# Patient Record
Sex: Male | Born: 1980 | Race: White | Hispanic: No | Marital: Single | Smoking: Current every day smoker
Health system: Southern US, Community
[De-identification: ages and names within clinical notes are randomized; demographics above are authoritative.]

## PROBLEM LIST (undated history)

## (undated) DIAGNOSIS — N289 Disorder of kidney and ureter, unspecified: Secondary | ICD-10-CM

## (undated) DIAGNOSIS — N200 Calculus of kidney: Secondary | ICD-10-CM

## (undated) HISTORY — PX: KIDNEY STONE SURGERY: SHX686

---

## 2011-05-04 ENCOUNTER — Emergency Department (HOSPITAL_COMMUNITY)
Admission: EM | Admit: 2011-05-04 | Discharge: 2011-05-04 | Disposition: A | Discharge status: Home / Self Care | Payer: Self-pay | Attending: Emergency Medicine | Admitting: Emergency Medicine

## 2011-05-04 ENCOUNTER — Encounter: Payer: Self-pay | Admitting: Emergency Medicine

## 2011-05-04 DIAGNOSIS — N200 Calculus of kidney: Secondary | ICD-10-CM | POA: Insufficient documentation

## 2011-05-04 DIAGNOSIS — R112 Nausea with vomiting, unspecified: Secondary | ICD-10-CM | POA: Insufficient documentation

## 2011-05-04 DIAGNOSIS — R109 Unspecified abdominal pain: Secondary | ICD-10-CM | POA: Insufficient documentation

## 2011-05-04 DIAGNOSIS — R51 Headache: Secondary | ICD-10-CM | POA: Insufficient documentation

## 2011-05-04 DIAGNOSIS — R197 Diarrhea, unspecified: Secondary | ICD-10-CM | POA: Insufficient documentation

## 2011-05-04 DIAGNOSIS — E869 Volume depletion, unspecified: Secondary | ICD-10-CM | POA: Insufficient documentation

## 2011-05-04 DIAGNOSIS — F17200 Nicotine dependence, unspecified, uncomplicated: Secondary | ICD-10-CM | POA: Insufficient documentation

## 2011-05-04 HISTORY — DX: Disorder of kidney and ureter, unspecified: N28.9

## 2011-05-04 LAB — DIFFERENTIAL
Basophils Absolute: 0 10*3/uL (ref 0.0–0.1)
Basophils Relative: 0 % (ref 0–1)
Eosinophils Absolute: 0 10*3/uL (ref 0.0–0.7)
Eosinophils Relative: 0 % (ref 0–5)
Monocytes Absolute: 0.8 10*3/uL (ref 0.1–1.0)
Monocytes Relative: 6 % (ref 3–12)
Neutro Abs: 11.3 10*3/uL — ABNORMAL HIGH (ref 1.7–7.7)

## 2011-05-04 LAB — CBC
HCT: 50.5 % (ref 39.0–52.0)
Hemoglobin: 18.5 g/dL — ABNORMAL HIGH (ref 13.0–17.0)
MCH: 30.4 pg (ref 26.0–34.0)
MCHC: 36.6 g/dL — ABNORMAL HIGH (ref 30.0–36.0)
MCV: 82.9 fL (ref 78.0–100.0)
RDW: 12.9 % (ref 11.5–15.5)

## 2011-05-04 LAB — COMPREHENSIVE METABOLIC PANEL
AST: 21 U/L (ref 0–37)
Albumin: 5.3 g/dL — ABNORMAL HIGH (ref 3.5–5.2)
BUN: 22 mg/dL (ref 6–23)
Calcium: 10.7 mg/dL — ABNORMAL HIGH (ref 8.4–10.5)
Creatinine, Ser: 1.45 mg/dL — ABNORMAL HIGH (ref 0.50–1.35)
Total Bilirubin: 0.6 mg/dL (ref 0.3–1.2)
Total Protein: 9.1 g/dL — ABNORMAL HIGH (ref 6.0–8.3)

## 2011-05-04 LAB — LIPASE, BLOOD: Lipase: 19 U/L (ref 11–59)

## 2011-05-04 MED ORDER — HYDROMORPHONE HCL PF 1 MG/ML IJ SOLN
1.0000 mg | Freq: Once | INTRAMUSCULAR | Status: AC
  Administered 2011-05-04: 1 mg via INTRAVENOUS
  Filled 2011-05-04: qty 1

## 2011-05-04 MED ORDER — SODIUM CHLORIDE 0.9 % IV BOLUS (SEPSIS)
1000.0000 mL | Freq: Once | INTRAVENOUS | Status: AC
  Administered 2011-05-04: 1000 mL via INTRAVENOUS

## 2011-05-04 MED ORDER — ONDANSETRON HCL 4 MG/2ML IJ SOLN
4.0000 mg | Freq: Once | INTRAMUSCULAR | Status: AC
  Administered 2011-05-04: 4 mg via INTRAVENOUS
  Filled 2011-05-04: qty 2

## 2011-05-04 MED ORDER — SODIUM CHLORIDE 0.9 % IV BOLUS (SEPSIS)
2000.0000 mL | Freq: Once | INTRAVENOUS | Status: AC
  Administered 2011-05-04: 2000 mL via INTRAVENOUS

## 2011-05-04 MED ORDER — PROMETHAZINE HCL 25 MG PO TABS: 25.0000 mg | ORAL_TABLET | Freq: Four times a day (QID) | ORAL | Status: DC | PRN

## 2011-05-04 MED ORDER — DIPHENHYDRAMINE HCL 50 MG/ML IJ SOLN
50.0000 mg | Freq: Once | INTRAMUSCULAR | Status: AC
  Administered 2011-05-04: 50 mg via INTRAVENOUS
  Filled 2011-05-04: qty 1

## 2011-05-04 NOTE — ED Notes (Signed)
Patient c/o headache, N/V/D that started this morning. Reports he smoked after a friend yesterday that had the flu. Patient c/o diffuse, aching abdominal pain. Patient alert/oriented x 4. Denies vision changes.

## 2011-05-04 NOTE — ED Notes (Signed)
Pt c/o n/v/d with headache since 8am.

## 2011-05-04 NOTE — ED Notes (Signed)
Pt requesting crackers, MD made aware, crackers given per MD order.

## 2011-05-04 NOTE — ED Notes (Signed)
MD at bedside. 

## 2011-05-04 NOTE — ED Notes (Signed)
Patient is resting comfortably. 

## 2011-05-04 NOTE — ED Provider Notes (Signed)
History     CSN: [id number] Arrival date & time: 05/04/2011  5:33 PM   First MD Initiated Contact with Patient 05/04/11 1739      Chief Complaint  Patient presents with  . Emesis  . Diarrhea  . Headache    (Consider location/radiation/quality/duration/timing/severity/associated sxs/prior treatment) HPI Patient with nausea, vomiting, and diarrhea today.  Patient with crampy abdominal pain.  No fever, decreased pos.  Reports many episodes of vomiting with watery emesis and liquid stool.  Patient states history of chronic kidney stones but does not feel like kidney stones.  Patient drank about six beers last night.   Past Medical History  Diagnosis Date  . Renal disorder     History reviewed. No pertinent past surgical history.  History reviewed. No pertinent family history.  History  Substance Use Topics  . Smoking status: Current Everyday Smoker    Types: Cigarettes  . Smokeless tobacco: Not on file  . Alcohol Use: Yes      Review of Systems  All other systems reviewed and are negative.    Allergies  Review of patient's allergies indicates no known allergies.  Home Medications  No current outpatient prescriptions on file.  BP 141/94  Pulse 103  Temp(Src) 98.5 F (36.9 C) (Oral)  Resp 20  Ht 6' (1.829 m)  Wt 160 lb (72.576 kg)  BMI 21.70 kg/m2  SpO2 98%  Physical Exam  Nursing note and vitals reviewed. Constitutional: He is oriented to person, place, and time. He appears well-developed.  HENT:  Head: Normocephalic and atraumatic.  Eyes: Conjunctivae and EOM are normal. Pupils are equal, round, and reactive to light.  Neck: Normal range of motion. Neck supple.  Cardiovascular: Normal rate.   Pulmonary/Chest: Effort normal and breath sounds normal.  Abdominal: Soft. Bowel sounds are normal. There is no tenderness.  Musculoskeletal: Normal range of motion.  Neurological: He is alert and oriented to person, place, and time. He has normal reflexes.    Skin: Skin is warm and dry.  Psychiatric: He has a normal mood and affect.    ED Course  Procedures (including critical care time) Results for orders placed during the hospital encounter of 05/04/11  CBC      Component Value Range   WBC 13.4 (*) 4.0 - 10.5 (K/uL)   RBC 6.09 (*) 4.22 - 5.81 (MIL/uL)   Hemoglobin 18.5 (*) 13.0 - 17.0 (g/dL)   HCT 40.9  81.1 - 91.4 (%)   MCV 82.9  78.0 - 100.0 (fL)   MCH 30.4  26.0 - 34.0 (pg)   MCHC 36.6 (*) 30.0 - 36.0 (g/dL)   RDW 78.2  95.6 - 21.3 (%)   Platelets 287  150 - 400 (K/uL)  DIFFERENTIAL      Component Value Range   Neutrophils Relative 85 (*) 43 - 77 (%)   Neutro Abs 11.3 (*) 1.7 - 7.7 (K/uL)   Lymphocytes Relative 9 (*) 12 - 46 (%)   Lymphs Abs 1.2  0.7 - 4.0 (K/uL)   Monocytes Relative 6  3 - 12 (%)   Monocytes Absolute 0.8  0.1 - 1.0 (K/uL)   Eosinophils Relative 0  0 - 5 (%)   Eosinophils Absolute 0.0  0.0 - 0.7 (K/uL)   Basophils Relative 0  0 - 1 (%)   Basophils Absolute 0.0  0.0 - 0.1 (K/uL)  COMPREHENSIVE METABOLIC PANEL      Component Value Range   Sodium 139  135 - 145 (mEq/L)   Potassium  4.3  3.5 - 5.1 (mEq/L)   Chloride 100  96 - 112 (mEq/L)   CO2 25  19 - 32 (mEq/L)   Glucose, Bld 100 (*) 70 - 99 (mg/dL)   BUN 22  6 - 23 (mg/dL)   Creatinine, Ser 7.84 (*) 0.50 - 1.35 (mg/dL)   Calcium 69.6 (*) 8.4 - 10.5 (mg/dL)   Total Protein 9.1 (*) 6.0 - 8.3 (g/dL)   Albumin 5.3 (*) 3.5 - 5.2 (g/dL)   AST 21  0 - 37 (U/L)   ALT 45  0 - 53 (U/L)   Alkaline Phosphatase 98  39 - 117 (U/L)   Total Bilirubin 0.6  0.3 - 1.2 (mg/dL)   GFR calc non Af Amer 63 (*) >90 (mL/min)   GFR calc Af Amer 73 (*) >90 (mL/min)  LIPASE, BLOOD      Component Value Range   Lipase 19  11 - 59 (U/L)     No diagnosis found.    MDM  Patient given ns 3 liters, i mg dilaudid and zofran.  Patient feels improved and taking po without difficulty.  Patient advised follow up for recheck of creatinine.         Hilario Quarry, MD 05/04/11  [phone]

## 2011-05-04 NOTE — ED Notes (Signed)
Patient's significant other out to nurse's station stating patient had hives on his left arm. No respiratory distress noted upon assessment of patient. Dr Rosalia Hammers informed and order for benadryl 50 mg obtained and given by Vernell Barrier, RN. Will continue to monitor patient.

## 2012-11-18 ENCOUNTER — Emergency Department (HOSPITAL_COMMUNITY): Payer: Self-pay

## 2012-11-18 ENCOUNTER — Emergency Department (HOSPITAL_COMMUNITY)
Admission: EM | Admit: 2012-11-18 | Discharge: 2012-11-18 | Disposition: A | Discharge status: Home / Self Care | Payer: Self-pay | Attending: Emergency Medicine | Admitting: Emergency Medicine

## 2012-11-18 ENCOUNTER — Encounter (HOSPITAL_COMMUNITY): Payer: Self-pay | Admitting: *Deleted

## 2012-11-18 DIAGNOSIS — F17200 Nicotine dependence, unspecified, uncomplicated: Secondary | ICD-10-CM | POA: Insufficient documentation

## 2012-11-18 DIAGNOSIS — N201 Calculus of ureter: Secondary | ICD-10-CM | POA: Insufficient documentation

## 2012-11-18 LAB — CBC WITH DIFFERENTIAL/PLATELET
Basophils Absolute: 0 10*3/uL (ref 0.0–0.1)
Basophils Relative: 0 % (ref 0–1)
HCT: 37.5 % — ABNORMAL LOW (ref 39.0–52.0)
Hemoglobin: 13.3 g/dL (ref 13.0–17.0)
Lymphocytes Relative: 29 % (ref 12–46)
MCHC: 35.5 g/dL (ref 30.0–36.0)
Monocytes Relative: 9 % (ref 3–12)
Neutro Abs: 3.9 10*3/uL (ref 1.7–7.7)
Neutrophils Relative %: 60 % (ref 43–77)
RDW: 12.8 % (ref 11.5–15.5)
WBC: 6.5 10*3/uL (ref 4.0–10.5)

## 2012-11-18 LAB — COMPREHENSIVE METABOLIC PANEL
ALT: 17 U/L (ref 0–53)
AST: 22 U/L (ref 0–37)
Albumin: 3.5 g/dL (ref 3.5–5.2)
Alkaline Phosphatase: 62 U/L (ref 39–117)
CO2: 29 mEq/L (ref 19–32)
Chloride: 103 mEq/L (ref 96–112)
GFR calc non Af Amer: 83 mL/min — ABNORMAL LOW (ref >90)
Potassium: 3.6 mEq/L (ref 3.5–5.1)
Total Bilirubin: 0.4 mg/dL (ref 0.3–1.2)

## 2012-11-18 LAB — URINALYSIS, ROUTINE W REFLEX MICROSCOPIC
Glucose, UA: NEGATIVE mg/dL
Leukocytes, UA: NEGATIVE
Nitrite: NEGATIVE
pH: 7 (ref 5.0–8.0)

## 2012-11-18 MED ORDER — HYDROMORPHONE HCL PF 1 MG/ML IJ SOLN
1.0000 mg | Freq: Once | INTRAMUSCULAR | Status: AC
  Administered 2012-11-18: 1 mg via INTRAVENOUS
  Filled 2012-11-18: qty 1

## 2012-11-18 MED ORDER — KETOROLAC TROMETHAMINE 30 MG/ML IJ SOLN
30.0000 mg | Freq: Once | INTRAMUSCULAR | Status: AC
  Administered 2012-11-18: 30 mg via INTRAVENOUS
  Filled 2012-11-18: qty 1

## 2012-11-18 MED ORDER — SODIUM CHLORIDE 0.9 % IV BOLUS (SEPSIS)
1000.0000 mL | Freq: Once | INTRAVENOUS | Status: AC
  Administered 2012-11-18: 1000 mL via INTRAVENOUS

## 2012-11-18 MED ORDER — ONDANSETRON HCL 4 MG/2ML IJ SOLN
4.0000 mg | Freq: Once | INTRAMUSCULAR | Status: AC
  Administered 2012-11-18: 4 mg via INTRAVENOUS
  Filled 2012-11-18: qty 2

## 2012-11-18 NOTE — ED Provider Notes (Signed)
History    CSN: [id number] Arrival date & time 11/18/12  1627  First MD Initiated Contact with Patient 11/18/12 1949     Chief Complaint  Patient presents with  . Flank Pain   (Consider location/radiation/quality/duration/timing/severity/associated sxs/prior Treatment) HPI  Patient with right flank pain since Sunday.  Seen at [facility] and told he had a kidney stone.  Prescribed zofran and oxycodone and states too expensive and waiting for check to come in.  States he has had kidney stones since age 22, but states he does not have a urologist.  Pain worse today.  States hasn't voided all day and has vomited whenever he drinks.   Past Medical History  Diagnosis Date  . Renal disorder    History reviewed. No pertinent past surgical history. History reviewed. No pertinent family history. History  Substance Use Topics  . Smoking status: Current Every Day Smoker    Types: Cigarettes  . Smokeless tobacco: Not on file  . Alcohol Use: Yes    Review of Systems  All other systems reviewed and are negative.    Allergies  Review of patient's allergies indicates no known allergies.  Home Medications  No current outpatient prescriptions on file. BP 127/83  Pulse 70  Temp(Src) 98.9 F (37.2 C) (Oral)  Resp 20  Ht 6' (1.829 m)  Wt 190 lb (86.183 kg)  BMI 25.76 kg/m2  SpO2 98% Physical Exam  Nursing note and vitals reviewed. Constitutional: He is oriented to person, place, and time. He appears well-developed and well-nourished.  HENT:  Head: Normocephalic and atraumatic.  Right Ear: External ear normal.  Left Ear: External ear normal.  Nose: Nose normal.  Mouth/Throat: Oropharynx is clear and moist.  Eyes: Conjunctivae and EOM are normal. Pupils are equal, round, and reactive to light.  Neck: Normal range of motion. Neck supple.  Cardiovascular: Normal rate, regular rhythm, normal heart sounds and intact distal pulses.   Pulmonary/Chest: Effort normal and breath  sounds normal. No respiratory distress. He has no wheezes. He exhibits no tenderness.  Abdominal: Soft. Bowel sounds are normal. He exhibits no distension and no mass. There is no tenderness. There is no guarding.  Musculoskeletal: Normal range of motion.  Neurological: He is alert and oriented to person, place, and time. He has normal reflexes. He exhibits normal muscle tone. Coordination normal.  Skin: Skin is warm and dry.  Psychiatric: He has a normal mood and affect. His behavior is normal. Judgment and thought content normal.    ED Course  Procedures (including critical care time) Labs Reviewed - No data to display No results found. No diagnosis found.  MDM   Results for orders placed during the hospital encounter of 11/18/12  CBC WITH DIFFERENTIAL      Result Value Range   WBC 6.5  4.0 - 10.5 K/uL   RBC 4.68  4.22 - 5.81 MIL/uL   Hemoglobin 13.3  13.0 - 17.0 g/dL   HCT 52.8 (*) 41.3 - 24.4 %   MCV 80.1  78.0 - 100.0 fL   MCH 28.4  26.0 - 34.0 pg   MCHC 35.5  30.0 - 36.0 g/dL   RDW 01.0  27.2 - 53.6 %   Platelets 198  150 - 400 K/uL   Neutrophils Relative % 60  43 - 77 %   Neutro Abs 3.9  1.7 - 7.7 K/uL   Lymphocytes Relative 29  12 - 46 %   Lymphs Abs 1.9  0.7 - 4.0 K/uL  Monocytes Relative 9  3 - 12 %   Monocytes Absolute 0.6  0.1 - 1.0 K/uL   Eosinophils Relative 1  0 - 5 %   Eosinophils Absolute 0.1  0.0 - 0.7 K/uL   Basophils Relative 0  0 - 1 %   Basophils Absolute 0.0  0.0 - 0.1 K/uL   Ct Abdomen Pelvis Wo Contrast  11/18/2012   *RADIOLOGY REPORT*  Clinical Data: Right flank pain.  CT ABDOMEN AND PELVIS WITHOUT CONTRAST  Technique:  Multidetector CT imaging of the abdomen and pelvis was performed following the standard protocol without intravenous contrast.  Comparison: None.  Findings: The lung bases are clear.  No pleural effusion or pericardial effusion.  The unenhanced appearance of the liver is unremarkable.  No biliary dilatation.  Gallbladder is normal.  No  common bile duct dilatation.  The pancreas is normal.  The spleen is normal.  The adrenal glands are normal.  Both kidneys demonstrate renal calculi.  There is moderate right- sided hydronephrosis and hydroureter down to an obstructing 6 x 5 mm right ureteral calculus located at the level of the iliac crest. No distal ureteral or bladder calculi.  The prostate gland and seminal vesicles are unremarkable.  The stomach, duodenum, small bowel and colon are grossly normal without oral contrast.  The appendix is normal.  No mesenteric or retroperitoneal mass or adenopathy.  The aorta is normal in caliber.  No atherosclerotic calcifications.  The bladder is unremarkable.  No pelvic mass, adenopathy or free pelvic fluid collections.  No inguinal mass or hernia. Calcifications are noted along the lateral margin of the gluteus medius muscle likely due to a previous muscle injury.  The bony structures are intact.  IMPRESSION:  1.  Bilateral renal calculi. 2.  6 x 5 mm right mid ureteral calculus located just above the right iliac crest causing moderate to high-grade obstruction.   Original Report Authenticated By: Rudie Meyer, M.D.    Patient with mid right ureteral calculus. This is causing high-grade obstruction.  Discussed with Dr. Jerre Simon and patient given appointment at 4 pm tomorrow.  Patient is pain free now and will fill prescription for percocet and zofran and follow up for possible for possible intervention.   Hilario Quarry, MD 11/18/12 [phone]

## 2012-11-18 NOTE — ED Notes (Addendum)
Rt flank pain, Seen at [facility] ER and told he has a kidney stone.  Hx of same  Says he could not afford the meds he was written for.

## 2012-11-19 ENCOUNTER — Ambulatory Visit (HOSPITAL_COMMUNITY)
Admission: RE | Admit: 2012-11-19 | Discharge: 2012-11-19 | Disposition: A | Discharge status: Home / Self Care | Payer: Self-pay | Source: Ambulatory Visit | Attending: Urology | Admitting: Urology

## 2012-11-19 ENCOUNTER — Other Ambulatory Visit (HOSPITAL_COMMUNITY): Payer: Self-pay | Admitting: Urology

## 2012-11-19 DIAGNOSIS — N200 Calculus of kidney: Secondary | ICD-10-CM

## 2012-11-19 DIAGNOSIS — R1031 Right lower quadrant pain: Secondary | ICD-10-CM | POA: Insufficient documentation

## 2013-12-26 ENCOUNTER — Encounter (HOSPITAL_COMMUNITY): Payer: Self-pay | Admitting: Emergency Medicine

## 2013-12-26 ENCOUNTER — Emergency Department (HOSPITAL_COMMUNITY)
Admission: EM | Admit: 2013-12-26 | Discharge: 2013-12-26 | Disposition: A | Discharge status: Home / Self Care | Payer: Self-pay | Attending: Emergency Medicine | Admitting: Emergency Medicine

## 2013-12-26 ENCOUNTER — Emergency Department (HOSPITAL_COMMUNITY): Payer: Self-pay

## 2013-12-26 DIAGNOSIS — Z87448 Personal history of other diseases of urinary system: Secondary | ICD-10-CM | POA: Insufficient documentation

## 2013-12-26 DIAGNOSIS — K2990 Gastroduodenitis, unspecified, without bleeding: Principal | ICD-10-CM

## 2013-12-26 DIAGNOSIS — R109 Unspecified abdominal pain: Secondary | ICD-10-CM | POA: Insufficient documentation

## 2013-12-26 DIAGNOSIS — R51 Headache: Secondary | ICD-10-CM | POA: Insufficient documentation

## 2013-12-26 DIAGNOSIS — F17200 Nicotine dependence, unspecified, uncomplicated: Secondary | ICD-10-CM | POA: Insufficient documentation

## 2013-12-26 DIAGNOSIS — R112 Nausea with vomiting, unspecified: Secondary | ICD-10-CM

## 2013-12-26 DIAGNOSIS — M549 Dorsalgia, unspecified: Secondary | ICD-10-CM | POA: Insufficient documentation

## 2013-12-26 DIAGNOSIS — R079 Chest pain, unspecified: Secondary | ICD-10-CM | POA: Insufficient documentation

## 2013-12-26 DIAGNOSIS — Z87442 Personal history of urinary calculi: Secondary | ICD-10-CM | POA: Insufficient documentation

## 2013-12-26 DIAGNOSIS — K2970 Gastritis, unspecified, without bleeding: Secondary | ICD-10-CM | POA: Insufficient documentation

## 2013-12-26 HISTORY — DX: Calculus of kidney: N20.0

## 2013-12-26 LAB — CBC WITH DIFFERENTIAL/PLATELET
BASOS ABS: 0 10*3/uL (ref 0.0–0.1)
Basophils Relative: 0 % (ref 0–1)
Eosinophils Absolute: 0.1 10*3/uL (ref 0.0–0.7)
Eosinophils Relative: 1 % (ref 0–5)
HCT: 47.8 % (ref 39.0–52.0)
Hemoglobin: 17.2 g/dL — ABNORMAL HIGH (ref 13.0–17.0)
LYMPHS ABS: 2.3 10*3/uL (ref 0.7–4.0)
LYMPHS PCT: 19 % (ref 12–46)
MCH: 29.2 pg (ref 26.0–34.0)
MCHC: 36 g/dL (ref 30.0–36.0)
MCV: 81 fL (ref 78.0–100.0)
Monocytes Absolute: 0.8 10*3/uL (ref 0.1–1.0)
Monocytes Relative: 7 % (ref 3–12)
NEUTROS PCT: 73 % (ref 43–77)
Neutro Abs: 8.9 10*3/uL — ABNORMAL HIGH (ref 1.7–7.7)
PLATELETS: 264 10*3/uL (ref 150–400)
RBC: 5.9 MIL/uL — AB (ref 4.22–5.81)
RDW: 13 % (ref 11.5–15.5)
WBC: 12 10*3/uL — AB (ref 4.0–10.5)

## 2013-12-26 LAB — COMPREHENSIVE METABOLIC PANEL
ALK PHOS: 99 U/L (ref 39–117)
ALT: 24 U/L (ref 0–53)
AST: 18 U/L (ref 0–37)
Albumin: 5 g/dL (ref 3.5–5.2)
Anion gap: 16 — ABNORMAL HIGH (ref 5–15)
BUN: 18 mg/dL (ref 6–23)
CO2: 25 meq/L (ref 19–32)
Calcium: 10.1 mg/dL (ref 8.4–10.5)
Chloride: 100 mEq/L (ref 96–112)
Creatinine, Ser: 1.3 mg/dL (ref 0.50–1.35)
GFR, EST AFRICAN AMERICAN: 81 mL/min — AB (ref >90)
GFR, EST NON AFRICAN AMERICAN: 70 mL/min — AB (ref >90)
GLUCOSE: 134 mg/dL — AB (ref 70–99)
POTASSIUM: 3.4 meq/L — AB (ref 3.7–5.3)
SODIUM: 141 meq/L (ref 137–147)
TOTAL PROTEIN: 8.5 g/dL — AB (ref 6.0–8.3)
Total Bilirubin: 0.7 mg/dL (ref 0.3–1.2)

## 2013-12-26 LAB — URINALYSIS, ROUTINE W REFLEX MICROSCOPIC
Bilirubin Urine: NEGATIVE
GLUCOSE, UA: NEGATIVE mg/dL
Ketones, ur: 15 mg/dL — AB
LEUKOCYTES UA: NEGATIVE
Nitrite: NEGATIVE
PH: 5.5 (ref 5.0–8.0)
PROTEIN: NEGATIVE mg/dL
Specific Gravity, Urine: 1.01 (ref 1.005–1.030)
Urobilinogen, UA: 0.2 mg/dL (ref 0.0–1.0)

## 2013-12-26 LAB — URINE MICROSCOPIC-ADD ON

## 2013-12-26 LAB — LIPASE, BLOOD: Lipase: 23 U/L (ref 11–59)

## 2013-12-26 MED ORDER — PROMETHAZINE HCL 25 MG PO TABS: 25.0000 mg | ORAL_TABLET | Freq: Four times a day (QID) | ORAL | Status: DC | PRN

## 2013-12-26 MED ORDER — PANTOPRAZOLE SODIUM 40 MG IV SOLR
40.0000 mg | Freq: Once | INTRAVENOUS | Status: AC
  Administered 2013-12-26: 40 mg via INTRAVENOUS
  Filled 2013-12-26: qty 40

## 2013-12-26 MED ORDER — HYDROCODONE-ACETAMINOPHEN 5-325 MG PO TABS: 1.0000 | ORAL_TABLET | Freq: Four times a day (QID) | ORAL | Status: DC | PRN

## 2013-12-26 MED ORDER — METOCLOPRAMIDE HCL 5 MG/ML IJ SOLN
INTRAMUSCULAR | Status: DC
Start: 2013-12-26 — End: 2013-12-27
  Filled 2013-12-26: qty 2

## 2013-12-26 MED ORDER — IOHEXOL 300 MG/ML  SOLN
100.0000 mL | Freq: Once | INTRAMUSCULAR | Status: AC | PRN
  Administered 2013-12-26: 100 mL via INTRAVENOUS

## 2013-12-26 MED ORDER — ONDANSETRON 4 MG PO TBDP: 4.0000 mg | ORAL_TABLET | Freq: Three times a day (TID) | ORAL | Status: DC | PRN

## 2013-12-26 MED ORDER — FENTANYL CITRATE 0.05 MG/ML IJ SOLN
50.0000 ug | Freq: Once | INTRAMUSCULAR | Status: AC
  Administered 2013-12-26: 19:00:00 via INTRAVENOUS

## 2013-12-26 MED ORDER — SODIUM CHLORIDE 0.9 % IJ SOLN
INTRAMUSCULAR | Status: AC
  Filled 2013-12-26: qty 24

## 2013-12-26 MED ORDER — SODIUM CHLORIDE 0.9 % IJ SOLN
INTRAMUSCULAR | Status: DC
Start: 2013-12-26 — End: 2013-12-27
  Filled 2013-12-26: qty 250

## 2013-12-26 MED ORDER — ONDANSETRON HCL 4 MG/2ML IJ SOLN: 4.0000 mg | Freq: Once | INTRAMUSCULAR | Status: DC

## 2013-12-26 MED ORDER — FAMOTIDINE 20 MG PO TABS: 20.0000 mg | ORAL_TABLET | Freq: Two times a day (BID) | ORAL | Status: DC

## 2013-12-26 MED ORDER — SODIUM CHLORIDE 0.9 % IV BOLUS (SEPSIS)
1000.0000 mL | Freq: Once | INTRAVENOUS | Status: AC
  Administered 2013-12-26: 1000 mL via INTRAVENOUS

## 2013-12-26 MED ORDER — METOCLOPRAMIDE HCL 5 MG/ML IJ SOLN
10.0000 mg | Freq: Once | INTRAMUSCULAR | Status: AC
  Administered 2013-12-26: 10 mg via INTRAVENOUS

## 2013-12-26 MED ORDER — SODIUM CHLORIDE 0.9 % IV SOLN
INTRAVENOUS | Status: DC
  Administered 2013-12-26: 21:00:00 via INTRAVENOUS

## 2013-12-26 MED ORDER — ONDANSETRON HCL 4 MG/2ML IJ SOLN
4.0000 mg | Freq: Once | INTRAMUSCULAR | Status: AC
  Administered 2013-12-26: 4 mg via INTRAVENOUS
  Filled 2013-12-26: qty 2

## 2013-12-26 MED ORDER — FENTANYL CITRATE 0.05 MG/ML IJ SOLN
INTRAMUSCULAR | Status: AC
  Filled 2013-12-26: qty 2

## 2013-12-26 MED ORDER — HYDROMORPHONE HCL PF 1 MG/ML IJ SOLN
1.0000 mg | Freq: Once | INTRAMUSCULAR | Status: AC
  Administered 2013-12-26: 1 mg via INTRAVENOUS
  Filled 2013-12-26: qty 1

## 2013-12-26 MED ORDER — ONDANSETRON HCL 4 MG/2ML IJ SOLN
INTRAMUSCULAR | Status: AC
  Filled 2013-12-26: qty 2

## 2013-12-26 MED ORDER — HYDROMORPHONE HCL PF 1 MG/ML IJ SOLN: 1.0000 mg | Freq: Once | INTRAMUSCULAR | Status: DC

## 2013-12-26 MED ORDER — ONDANSETRON HCL 4 MG/2ML IJ SOLN
4.0000 mg | Freq: Once | INTRAMUSCULAR | Status: AC
  Administered 2013-12-26: 4 mg via INTRAVENOUS

## 2013-12-26 MED ORDER — IOHEXOL 300 MG/ML  SOLN
50.0000 mL | Freq: Once | INTRAMUSCULAR | Status: AC | PRN
  Administered 2013-12-26: 50 mL via ORAL

## 2013-12-26 NOTE — Discharge Instructions (Signed)
Gastritis, Adult Gastritis is soreness and puffiness (inflammation) of the lining of the stomach. If you do not get help, gastritis can cause bleeding and sores (ulcers) in the stomach. HOME CARE   Only take medicine as told by your doctor.  If you were given antibiotic medicines, take them as told. Finish the medicines even if you start to feel better.  Drink enough fluids to keep your pee (urine) clear or pale yellow.  Avoid foods and drinks that make your problems worse. Foods you may want to avoid include:  Caffeine or alcohol.  Chocolate.  Mint.  Garlic and onions.  Spicy foods.  Citrus fruits, including oranges, lemons, or limes.  Food containing tomatoes, including sauce, chili, salsa, and pizza.  Fried and fatty foods.  Eat small meals throughout the day instead of large meals. GET HELP RIGHT AWAY IF:   You have black or dark red poop (stools).  You throw up (vomit) blood. It may look like coffee grounds.  You cannot keep fluids down.  Your belly (abdominal) pain gets worse.  You have a fever.  You do not feel better after 1 week.  You have any other questions or concerns. MAKE SURE YOU:   Understand these instructions.  Will watch your condition.  Will get help right away if you are not doing well or get worse. Document Released: 10/23/2007 Document Revised: 07/29/2011 Document Reviewed: 06/19/2011 [facility] Patient Information 2015 [employer] [place]. This information is not intended to replace advice given to you by your health care provider. Make sure you discuss any questions you have with your health care provider.  Recommend followup by GI medicine referral provided. Take the Pepcid as directed. Take the Phenergan and Zofran as needed for nausea and vomiting. Take pain medicine as needed. Return for any newer worse symptoms. Resource guide provided below to help you find a primary care Dr.   Emergency Department Resource Guide 1) Find a Doctor and  Pay Out of Pocket Although you won't have to find out who is covered by your insurance plan, it is a good idea to ask around and get recommendations. You will then need to call the office and see if the doctor you have chosen will accept you as a new patient and what types of options they offer for patients who are self-pay. Some doctors offer discounts or will set up payment plans for their patients who do not have insurance, but you will need to ask so you aren't surprised when you get to your appointment.  2) Contact Your Local Health Department Not all health departments have doctors that can see patients for sick visits, but many do, so it is worth a call to see if yours does. If you don't know where your local health department is, you can check in your phone book. The CDC also has a tool to help you locate your state's health department, and many state websites also have listings of all of their local health departments.  3) Find a Walk-in Clinic If your illness is not likely to be very severe or complicated, you may want to try a walk in clinic. These are popping up all over the country in pharmacies, drugstores, and shopping centers. They're usually staffed by nurse practitioners or physician assistants that have been trained to treat common illnesses and complaints. They're usually fairly quick and inexpensive. However, if you have serious medical issues or chronic medical problems, these are probably not your best option.  No Primary Care  Doctor: - Call Health Connect at  [phone] - they can help you locate a primary care doctor that  accepts your insurance, provides certain services, etc. - Physician Referral Service- [phone]  Chronic Pain Problems: Organization         Address  Phone   Notes  [facility]  [phone] Patients need to be referred by their primary care doctor.   Medication Assistance: Organization         Address  Phone   Notes  [facility] [address] [place] [place], [place] [address] [phone] --Must be a resident of [facility] -- Must have NO insurance coverage whatsoever (no Medicaid/ Medicare, etc.) -- The pt. MUST have a primary care doctor that directs their care regularly and follows them in the community   [facility]  [phone]   [employer]  [phone]    Agencies that provide inexpensive medical care: Organization         Address  Phone   Notes  [facility]  [phone]   [facility]    [phone]   [facility] [address] [place], [place] [address] [phone]   [facility] [address] [place] [place] [phone]   Planned Parenthood    [phone]   [facility]    [phone]   [facility]  [address], [place] Phone:  [phone], Fax:  [phone] Hours of Operation:  9 am - 6 pm, M-F.  Also accepts Medicaid/Medicare and self-pay.  [facility]  [address], [place] Phone: [phone], Fax: [phone]. Hours of Operation:  8:30 am - 5:30 pm, M-F.  Also accepts Medicaid and self-pay.  [facility] [place] [address], [place] Phone: [phone]   [facility] [address] Natasha Bence [place], [place] [phone], Ext. [phone] Mondays & Thursdays: 7-9 AM.  First 15 patients are seen on a first come, first serve basis.    Medicaid-accepting [facility] Providers:  Organization         Address  Phone   Notes  [facility] [address], [place] [phone] Also accepts self-pay patients.  [facility] [address] Laurell Josephs [place], [place]  [phone]   [facility] [address], [place] [phone]   [facility] [address] [place] [phone]   Renaye Rakers [address], [place]   [phone] Only accepts [place] Access [place] patients after they have their name applied to their card.   Self-Pay (no insurance) in [facility]:  Organization         Address  Phone   Notes  Sickle Cell Patients, [facility] [address] [place], [place] [phone]   [facility] [address] [place], [place] [phone]   [facility] [place]  [address], [place] [phone]   [facility]/Dr. Osei-Bonsu  [address] [place] or [address], [place] [phone] Phone number for both [place] and [place] locations is the same.  [facility] [address] Gin[place] Otto [phone]   [facility] [address], [place] or [address] [phone]   [facility] [address] [place] [phone], phone; [phone], fax Sees patients 1st and 3rd Saturday of every month.  Must not qualify for public or private insurance (i.e. Medicaid, Medicare, [place] Health Choice, Veterans' Benefits)  Household income should be no more than 200% of the poverty level The clinic cannot treat you if you are pregnant or think you are pregnant  Sexually transmitted diseases are not treated at the clinic.    Dental Care: Organization         Address  Phone  Notes  [facility] Department of [facility] [address] [place], [place] [phone] Accepts children up to age 49 who are enrolled in [place] or [place] Health Choice; pregnant women with a Medicaid card; and children who have applied for Medicaid or [place] Health Choice, but were declined, whose parents can pay a reduced fee at time of service.  [facility] Department of [facility]  [address], [place] [phone] Accepts children up to age 14 who are enrolled in [place] or [place] Health Choice; pregnant women with a Medicaid card; and children who have applied for Medicaid or [place] Health Choice, but were declined, whose parents can pay a reduced fee at time of service.  [facility]  [address] [place], [place] [phone] Patients are seen by appointment only. Walk-ins are not accepted. [facility] will see patients 45 years of age and older. Monday - Tuesday (8am-5pm) Most Wednesdays (8:30-5pm) $30 per visit, cash only  [facility]  [address], [facility] [phone] Patients are seen by appointment only. Walk-ins are not accepted. [facility] will see patients 53 years of age and older. One Wednesday Evening (Monthly: Volunteer Based).  $30 per visit, cash only  [employer]  [phone] for adults; Children under age 80, call Graduate Pediatric Dentistry at [phone]. Children aged 74-14, please call [phone] to request a pediatric application.  Dental services are provided in all areas of dental care including fillings, crowns and bridges, complete and partial dentures, implants, gum treatment, root canals, and extractions. Preventive care is also provided. Treatment is provided to both adults and children. Patients are selected via a lottery and there is often a waiting list.   [facility] [address] [place]  [phone] [email or web address]   [facility] [address] [place], [place] [phone], Ext. [phone] Second and Fourth Thursday of each month, opens at 6:30 AM; Clinic ends at 9 AM.  Patients are seen on a first-come first-served basis, and a limited number are seen during each clinic.   [facility]  [address] Ether Griffins [place], [place] [phone]   Eligibility Requirements You must have lived in [place], [place], or  [place] counties for at least the last three months.   You cannot be eligible for state or federal sponsored [place], including [employer], [place], or [employer].   You generally cannot be eligible for healthcare insurance through your employer.    How to apply: Eligibility screenings are held every Tuesday and Wednesday afternoon from 1:00 pm until 4:00 pm. You do not need an appointment for the interview!  [facility] [address] [place], [place] [phone]   Aaron Edelman  [employer] Department  [phone]   [facility]  [phone]   [facility] Health Department  [phone]    Behavioral Health Resources in the Community: Intensive Outpatient Programs Organization         Address  Phone  Notes  [facility] [address] [place], [place] [phone]   [facility] [address] [place], [place] [phone]   [facility] [address], [place], [place]  [phone]   [facility] Health [address]  [place], [place] [phone] or [phone]   Substance Abuse Resources Organization         Address  Phone  Notes  [facility]  [phone]   [facility]  [phone]   [facility]  [phone]   Floydene Flock  [phone]   Residential & Outpatient Substance Abuse Program  [phone]   Psychological Services Organization         Address  Phone  Notes  [facility]  [phone]   [facility]  [phone]   [facility] [address] [place] [phone] or [phone]    Mobile Crisis Teams Organization         Address  Phone  Notes  [facility], Mobile Crisis Care Unit  [phone]   [facility]  [address] [place], [place] [phone]   Doristine Locks [address] [place] [phone]    Self-Help/Support Groups Organization         Address  Phone             Notes  [facility] [place] - variety of support groups  336- I7437963 Call for more information  Narcotics Anonymous (NA), [facility] [address], [employer] [place]  2 meetings at this location   Statistician         Address  Phone  Notes  [facility] [address],    [place]  [phone]   [facility]  [address], [place] [address], [place], [place] [phone]   [facility] Treatment Facility [address] [place], [place] [phone] Admissions: 8am-3pm M-F  [facility] [address],    [place], [place] [phone]   [facility] [address] [place], [place], [place] [phone]   [facility] [address]  [place], [place] [phone]   [facility] - Intensive Outpatient [address] Laurell Josephs [address], [place], [place] [phone]   [facility] ([facility] [address] [place]  [place], [place] [phone] or [phone]   [facility] ([facility]) [address], [place], [place] [phone] Accepts Medicaid  [facility] [address],  [place] [phone] Substance Abuse/Addiction Treatment   [facility] Organization         Address  Phone  Notes  [facility]  [phone]   Angie Fava, PhD [address] Ervin Knack [place], [place]   [phone] or [phone]   [facility] Behavioral   [address] [place], [place] [phone]   [facility] [address] [place], [place] [phone] Insurance/Medicaid/sponsorship through [employer] and Families [address]  [place], [place] [phone] Therapy/tele-psych/case  °[facility] [address]  ° [place], [place] [phone]     °Dr. Arfeen  [phone]   °[facility] [place]  [employer] [place] [facility] 1) [address], [place] °2) [address], [place] °3)  [address], [place] [phone] °([phone]) [phone] ° °([phone]) [phone]   °[place] Child Abuse Hotline (336) 342-1394 or (336) [phone] (After Hours)    ° ° ° °

## 2013-12-26 NOTE — ED Notes (Signed)
PT c/o nausea vomiting and epigastric abdominal pain x2 days.

## 2013-12-26 NOTE — ED Provider Notes (Signed)
CSN: [id number]     Arrival date & time 12/26/13  1816 History  This chart was scribed for Scott Mulders, MD by Elon Spanner, ED Scribe. This patient was seen in room APA18/APA18 and the patient's care was started at 7:25 PM.    Chief Complaint  Patient presents with  . Abdominal Pain   Patient is a 33 y.o. male presenting with abdominal pain. The history is provided by the patient. No language interpreter was used.  Abdominal Pain Associated symptoms: chest pain, chills, nausea and vomiting   Associated symptoms: no cough, no diarrhea, no dysuria, no fever, no shortness of breath and no sore throat     HPI Comments: Scott Blackwell is a 33 y.o. male with a history of kidney stones who presents to the Emergency Department complaining of upper quadrant abdominal pain that began yesterday evening.  He rates his abdominal pain currently at an 8.5/10 and describes it as cramping/indigestion. He states he has associated vomiting that began yesterday morning with approximately 20 episodes of coffee-ground colored emesis both today and yesterday.  He states that a typical episode onsets with a feeling of indigestion.  He reports that the vomiting is precipitated by food or fluid intake.   The patient states that his throat is raw and that he has been spitting up blood.  He rates his associated throat pain an 8/10 currently.   The patient reports similar episodes of abdominal pain and vomiting over the last several years that often begin spontaneously at any time of the day.  He reports that when this occurs, the symptoms are relieved by a bath.  Patients states he has had associated chills, diaphoresis, CP.  Patient denies diarrhea and states that his bowel movements are normal.   Patient denies visual changes, cough, congestion, rhinorrhea, SOB, dysuria, leg swelling.  NKA.  WUJ:[id number]  Past Medical History  Diagnosis Date  . Renal disorder   . Kidney stones    Past Surgical History  Procedure  Laterality Date  . Kidney stone surgery     No family history on file. History  Substance Use Topics  . Smoking status: Current Every Day Smoker -- 1.00 packs/day    Types: Cigarettes  . Smokeless tobacco: Not on file  . Alcohol Use: Yes     Comment: occassionally    Review of Systems  Constitutional: Positive for chills and diaphoresis. Negative for fever.  HENT: Negative for congestion, rhinorrhea and sore throat.   Eyes: Negative for visual disturbance.  Respiratory: Negative for cough and shortness of breath.   Cardiovascular: Positive for chest pain. Negative for leg swelling.  Gastrointestinal: Positive for nausea, vomiting and abdominal pain. Negative for diarrhea.  Genitourinary: Negative for dysuria.  Musculoskeletal: Positive for back pain. Negative for neck pain.  Skin: Negative for rash.  Neurological: Positive for headaches.  Hematological: Does not bruise/bleed easily.  Psychiatric/Behavioral: Negative for confusion.      Allergies  Review of patient's allergies indicates no known allergies.  Home Medications   Prior to Admission medications   Medication Sig Start Date End Date Taking? Authorizing Provider  bismuth subsalicylate (PEPTO BISMOL) 262 MG/15ML suspension Take 30 mLs by mouth once as needed for indigestion.   Yes Historical Provider, MD  Ca Carbonate-Mag Hydroxide (ROLAIDS PO) Take 1-2 tablets by mouth as needed.   Yes Historical Provider, MD  famotidine (PEPCID) 20 MG tablet Take 1 tablet (20 mg total) by mouth 2 (two) times daily. 12/26/13   Scott Mulders, MD  HYDROcodone-acetaminophen (NORCO/VICODIN) 5-325 MG per tablet Take 1-2 tablets by mouth every 6 (six) hours as needed. 12/26/13   Scott MuldersScott Bryor Rami, MD  ondansetron (ZOFRAN ODT) 4 MG disintegrating tablet Take 1 tablet (4 mg total) by mouth every 8 (eight) hours as needed. 12/26/13   Scott MuldersScott Shy Guallpa, MD  promethazine (PHENERGAN) 25 MG tablet Take 1 tablet (25 mg total) by mouth every 6 (six) hours  as needed for nausea. 05/04/11 05/11/11  Hilario Quarryanielle S Ray, MD  promethazine (PHENERGAN) 25 MG tablet Take 1 tablet (25 mg total) by mouth every 6 (six) hours as needed. 12/26/13   Scott MuldersScott Erianna Jolly, MD   BP 153/95  Pulse 87  Temp(Src) 97.8 F (36.6 C) (Oral)  Resp 20  Ht 5\' 11"  (1.803 m)  Wt 180 lb (81.647 kg)  BMI 25.12 kg/m2  SpO2 100% Physical Exam  Nursing note and vitals reviewed. Constitutional: He is oriented to person, place, and time. He appears well-developed and well-nourished. No distress.  HENT:  Head: Normocephalic and atraumatic.  Eyes: Conjunctivae and EOM are normal.  Neck: Neck supple. No tracheal deviation present.  Cardiovascular: Normal rate and regular rhythm.   Pulmonary/Chest: Effort normal and breath sounds normal. No respiratory distress. He has no wheezes. He has no rales.  Abdominal: Bowel sounds are normal. There is no tenderness.  Musculoskeletal: Normal range of motion.  Neurological: He is alert and oriented to person, place, and time. No cranial nerve deficit. He exhibits normal muscle tone. Coordination normal.  Skin: Skin is warm and dry.  Psychiatric: He has a normal mood and affect. His behavior is normal.    ED Course  Procedures (including critical care time)  DIAGNOSTIC STUDIES: Oxygen Saturation is 100% on RA, normal by my interpretation.    COORDINATION OF CARE:  7:35 PM Will order pain medication, anti-emetics and IV fluids.  Will obtain imaging.  Patient acknowledges and agrees with plan.     Labs Review Labs Reviewed  CBC WITH DIFFERENTIAL - Abnormal; Notable for the following:    WBC 12.0 (*)    RBC 5.90 (*)    Hemoglobin 17.2 (*)    Neutro Abs 8.9 (*)    All other components within normal limits  COMPREHENSIVE METABOLIC PANEL - Abnormal; Notable for the following:    Potassium 3.4 (*)    Glucose, Bld 134 (*)    Total Protein 8.5 (*)    GFR calc non Af Amer 70 (*)    GFR calc Af Amer 81 (*)    Anion gap 16 (*)    All other  components within normal limits  URINALYSIS, ROUTINE W REFLEX MICROSCOPIC - Abnormal; Notable for the following:    Hgb urine dipstick SMALL (*)    Ketones, ur 15 (*)    All other components within normal limits  LIPASE, BLOOD  URINE MICROSCOPIC-ADD ON   Results for orders placed during the hospital encounter of 12/26/13  CBC WITH DIFFERENTIAL      Result Value Ref Range   WBC 12.0 (*) 4.0 - 10.5 K/uL   RBC 5.90 (*) 4.22 - 5.81 MIL/uL   Hemoglobin 17.2 (*) 13.0 - 17.0 g/dL   HCT 16.147.8  09.639.0 - 04.552.0 %   MCV 81.0  78.0 - 100.0 fL   MCH 29.2  26.0 - 34.0 pg   MCHC 36.0  30.0 - 36.0 g/dL   RDW 40.913.0  81.111.5 - 91.415.5 %   Platelets 264  150 - 400 K/uL   Neutrophils Relative % 73  43 - 77 %   Neutro Abs 8.9 (*) 1.7 - 7.7 K/uL   Lymphocytes Relative 19  12 - 46 %   Lymphs Abs 2.3  0.7 - 4.0 K/uL   Monocytes Relative 7  3 - 12 %   Monocytes Absolute 0.8  0.1 - 1.0 K/uL   Eosinophils Relative 1  0 - 5 %   Eosinophils Absolute 0.1  0.0 - 0.7 K/uL   Basophils Relative 0  0 - 1 %   Basophils Absolute 0.0  0.0 - 0.1 K/uL  COMPREHENSIVE METABOLIC PANEL      Result Value Ref Range   Sodium 141  137 - 147 mEq/L   Potassium 3.4 (*) 3.7 - 5.3 mEq/L   Chloride 100  96 - 112 mEq/L   CO2 25  19 - 32 mEq/L   Glucose, Bld 134 (*) 70 - 99 mg/dL   BUN 18  6 - 23 mg/dL   Creatinine, Ser 2.95  0.50 - 1.35 mg/dL   Calcium 62.1  8.4 - 30.8 mg/dL   Total Protein 8.5 (*) 6.0 - 8.3 g/dL   Albumin 5.0  3.5 - 5.2 g/dL   AST 18  0 - 37 U/L   ALT 24  0 - 53 U/L   Alkaline Phosphatase 99  39 - 117 U/L   Total Bilirubin 0.7  0.3 - 1.2 mg/dL   GFR calc non Af Amer 70 (*) >90 mL/min   GFR calc Af Amer 81 (*) >90 mL/min   Anion gap 16 (*) 5 - 15  LIPASE, BLOOD      Result Value Ref Range   Lipase 23  11 - 59 U/L  URINALYSIS, ROUTINE W REFLEX MICROSCOPIC      Result Value Ref Range   Color, Urine YELLOW  YELLOW   APPearance CLEAR  CLEAR   Specific Gravity, Urine 1.010  1.005 - 1.030   pH 5.5  5.0 - 8.0    Glucose, UA NEGATIVE  NEGATIVE mg/dL   Hgb urine dipstick SMALL (*) NEGATIVE   Bilirubin Urine NEGATIVE  NEGATIVE   Ketones, ur 15 (*) NEGATIVE mg/dL   Protein, ur NEGATIVE  NEGATIVE mg/dL   Urobilinogen, UA 0.2  0.0 - 1.0 mg/dL   Nitrite NEGATIVE  NEGATIVE   Leukocytes, UA NEGATIVE  NEGATIVE  URINE MICROSCOPIC-ADD ON      Result Value Ref Range   RBC / HPF 3-6  <3 RBC/hpf   Urine-Other MUCOUS PRESENT      Imaging Review Dg Chest 2 View  12/26/2013   CLINICAL DATA:  Abdominal pain.  Weakness.  EXAM: CHEST  2 VIEW  COMPARISON:  No priors.  FINDINGS: Lung volumes are normal. No consolidative airspace disease. No pleural effusions. No pneumothorax. No pulmonary nodule or mass noted. Pulmonary vasculature and the cardiomediastinal silhouette are within normal limits.  IMPRESSION: No radiographic evidence of acute cardiopulmonary disease.   Electronically Signed   By: Trudie Reed M.D.   On: 12/26/2013 21:04   Ct Abdomen Pelvis W Contrast  12/26/2013   CLINICAL DATA:  Abdominal pain.  EXAM: CT ABDOMEN AND PELVIS WITH CONTRAST  TECHNIQUE: Multidetector CT imaging of the abdomen and pelvis was performed using the standard protocol following bolus administration of intravenous contrast.  CONTRAST:  OMNIPAQUE IOHEXOL 300 MG/ML SOLN, 50mL OMNIPAQUE IOHEXOL 300 MG/ML SOLN  COMPARISON:  CT of the abdomen and pelvis 12/03/2012.  FINDINGS: Lung Bases: Unremarkable.  Abdomen/Pelvis: The appearance of the liver, gallbladder, pancreas, spleen,  bilateral adrenal glands and right kidney is unremarkable. Several small nonobstructive calculi are noted within the left renal collecting system, largest of which measures 7 mm in the lower pole. No additional calculi are noted along the course of either ureter or within the lumen of the urinary bladder. No hydroureteronephrosis or perinephric stranding to suggest urinary tract obstruction at this time.  No significant volume of ascites. No pneumoperitoneum. No  pathologic distention of small bowel. Normal appendix. No lymphadenopathy identified in the abdomen or pelvis. Prostate gland and urinary bladder are unremarkable in appearance.  Musculoskeletal: There are no aggressive appearing lytic or blastic lesions noted in the visualized portions of the skeleton.  IMPRESSION: 1. No acute findings in the abdomen or pelvis to account for the patient's symptoms. 2. Normal appendix. 3. Several small nonobstructive calculi are present within the left renal collecting system, largest of which measures 7 mm in the lower pole. No ureteral stones or findings of urinary tract obstruction are noted at this time.   Electronically Signed   By: Trudie Reed M.D.   On: 12/26/2013 21:13     EKG Interpretation None      MDM   Final diagnoses:  Nausea and vomiting, vomiting of unspecified type  Gastritis   Workup without significant findings. Symptoms seem to be consistent with a gastritis perhaps history of significant gastro-esophageal reflux disease. Will treat with Pepcid. Will treat with antinausea medicine pain medicine. CT scan without any significant findings. Urinalysis not consistent with urinary tract infection no evidence of pancreatitis the some lipase. No significant liver function test abnormalities. No significant evidence of dehydration. However hemoglobin and hematocrit is elevated. Patient hydrated here well pain improved nausea and vomiting controlled better. No evidence of any significant GI bleed. Patient was having some coffee-ground emesis.  I personally performed the services described in this documentation, which was scribed in my presence. The recorded information has been reviewed and is accurate.     Scott Mulders, MD 12/26/13 2303

## 2014-08-14 ENCOUNTER — Emergency Department (HOSPITAL_COMMUNITY)
Admission: EM | Admit: 2014-08-14 | Discharge: 2014-08-14 | Disposition: A | Discharge status: Home / Self Care | Payer: Self-pay | Attending: Emergency Medicine | Admitting: Emergency Medicine

## 2014-08-14 ENCOUNTER — Encounter (HOSPITAL_COMMUNITY): Payer: Self-pay | Admitting: Emergency Medicine

## 2014-08-14 DIAGNOSIS — Z79899 Other long term (current) drug therapy: Secondary | ICD-10-CM | POA: Insufficient documentation

## 2014-08-14 DIAGNOSIS — N200 Calculus of kidney: Secondary | ICD-10-CM | POA: Insufficient documentation

## 2014-08-14 DIAGNOSIS — Z720 Tobacco use: Secondary | ICD-10-CM | POA: Insufficient documentation

## 2014-08-14 DIAGNOSIS — R112 Nausea with vomiting, unspecified: Secondary | ICD-10-CM | POA: Insufficient documentation

## 2014-08-14 DIAGNOSIS — R61 Generalized hyperhidrosis: Secondary | ICD-10-CM | POA: Insufficient documentation

## 2014-08-14 LAB — URINALYSIS, ROUTINE W REFLEX MICROSCOPIC
Glucose, UA: NEGATIVE mg/dL
KETONES UR: NEGATIVE mg/dL
LEUKOCYTES UA: NEGATIVE
NITRITE: NEGATIVE
PH: 6.5 (ref 5.0–8.0)
PROTEIN: 30 mg/dL — AB
SPECIFIC GRAVITY, URINE: 1.025 (ref 1.005–1.030)
Urobilinogen, UA: 0.2 mg/dL (ref 0.0–1.0)

## 2014-08-14 LAB — CBC WITH DIFFERENTIAL/PLATELET
BASOS PCT: 0 % (ref 0–1)
Basophils Absolute: 0 10*3/uL (ref 0.0–0.1)
EOS PCT: 1 % (ref 0–5)
Eosinophils Absolute: 0.1 10*3/uL (ref 0.0–0.7)
HCT: 44 % (ref 39.0–52.0)
Hemoglobin: 15 g/dL (ref 13.0–17.0)
Lymphocytes Relative: 15 % (ref 12–46)
Lymphs Abs: 2 10*3/uL (ref 0.7–4.0)
MCH: 28.7 pg (ref 26.0–34.0)
MCHC: 34.1 g/dL (ref 30.0–36.0)
MCV: 84.3 fL (ref 78.0–100.0)
MONOS PCT: 4 % (ref 3–12)
Monocytes Absolute: 0.5 10*3/uL (ref 0.1–1.0)
Neutro Abs: 10.6 10*3/uL — ABNORMAL HIGH (ref 1.7–7.7)
Neutrophils Relative %: 80 % — ABNORMAL HIGH (ref 43–77)
Platelets: 247 10*3/uL (ref 150–400)
RBC: 5.22 MIL/uL (ref 4.22–5.81)
RDW: 13.4 % (ref 11.5–15.5)
WBC: 13.1 10*3/uL — ABNORMAL HIGH (ref 4.0–10.5)

## 2014-08-14 LAB — COMPREHENSIVE METABOLIC PANEL
ALK PHOS: 79 U/L (ref 39–117)
ALT: 52 U/L (ref 0–53)
AST: 37 U/L (ref 0–37)
Albumin: 4 g/dL (ref 3.5–5.2)
Anion gap: 8 (ref 5–15)
BUN: 16 mg/dL (ref 6–23)
CO2: 26 mmol/L (ref 19–32)
CREATININE: 1.25 mg/dL (ref 0.50–1.35)
Calcium: 9.2 mg/dL (ref 8.4–10.5)
Chloride: 107 mmol/L (ref 96–112)
GFR, EST AFRICAN AMERICAN: 85 mL/min — AB (ref >90)
GFR, EST NON AFRICAN AMERICAN: 73 mL/min — AB (ref >90)
GLUCOSE: 121 mg/dL — AB (ref 70–99)
Potassium: 3.4 mmol/L — ABNORMAL LOW (ref 3.5–5.1)
Sodium: 141 mmol/L (ref 135–145)
TOTAL PROTEIN: 7.1 g/dL (ref 6.0–8.3)
Total Bilirubin: 0.5 mg/dL (ref 0.3–1.2)

## 2014-08-14 LAB — URINE MICROSCOPIC-ADD ON

## 2014-08-14 MED ORDER — TAMSULOSIN HCL 0.4 MG PO CAPS: 0.4000 mg | ORAL_CAPSULE | Freq: Every day | ORAL | Status: DC

## 2014-08-14 MED ORDER — HYDROMORPHONE HCL 1 MG/ML IJ SOLN
1.0000 mg | Freq: Once | INTRAMUSCULAR | Status: AC
  Administered 2014-08-14: 1 mg via INTRAVENOUS
  Filled 2014-08-14: qty 1

## 2014-08-14 MED ORDER — SODIUM CHLORIDE 0.9 % IV BOLUS (SEPSIS)
1000.0000 mL | Freq: Once | INTRAVENOUS | Status: AC
  Administered 2014-08-14: 1000 mL via INTRAVENOUS

## 2014-08-14 MED ORDER — OXYCODONE-ACETAMINOPHEN 5-325 MG PO TABS: 1.0000 | ORAL_TABLET | Freq: Four times a day (QID) | ORAL | Status: DC | PRN

## 2014-08-14 MED ORDER — PROMETHAZINE HCL 25 MG PO TABS: 25.0000 mg | ORAL_TABLET | Freq: Four times a day (QID) | ORAL | Status: DC | PRN

## 2014-08-14 MED ORDER — IBUPROFEN 800 MG PO TABS: 800.0000 mg | ORAL_TABLET | Freq: Three times a day (TID) | ORAL | Status: AC | PRN

## 2014-08-14 MED ORDER — ONDANSETRON HCL 4 MG/2ML IJ SOLN
4.0000 mg | Freq: Once | INTRAMUSCULAR | Status: AC
  Administered 2014-08-14: 4 mg via INTRAVENOUS
  Filled 2014-08-14: qty 2

## 2014-08-14 MED ORDER — KETOROLAC TROMETHAMINE 30 MG/ML IJ SOLN
30.0000 mg | Freq: Once | INTRAMUSCULAR | Status: AC
  Administered 2014-08-14: 30 mg via INTRAVENOUS
  Filled 2014-08-14: qty 1

## 2014-08-14 MED ORDER — ONDANSETRON HCL 4 MG/2ML IJ SOLN
INTRAMUSCULAR | Status: AC
  Administered 2014-08-14: 4 mg via INTRAVENOUS
  Filled 2014-08-14: qty 2

## 2014-08-14 NOTE — ED Provider Notes (Signed)
This chart was scribed for Layla MawKristen N Ward, DO by Tonye RoyaltyJoshua Chen, ED Scribe. This patient was seen in room APA16A/APA16A   TIME SEEN: 1:11 PM  CHIEF COMPLAINT: flank pain  HPI: Scott Blackwell is a 34 y.o. male with history of kidney stones who presents to the Emergency Department complaining of left flank pain radiating into left testicle with onset this morning. He reports associated nausea, vomiting, and diaphoresis. He denies any injury to his back. He denies numbness, weakness, tingling in arms or legs, dysuria, hematuria, or penile discharge. States this feels similar to his prior kidney since. No aggravating or relieving factors. No fever.  ROS: See HPI Constitutional: no fever, positive diaphoresis Eyes: no drainage  ENT: no runny nose   Cardiovascular:  no chest pain  Resp: no SOB  GI: positive nausea and vomiting GU: no dysuria, hematuria, penile discharge, positive flank pain and testicular pain Integumentary: no rash  Allergy: no hives  Musculoskeletal: no leg swelling  Neurological: no slurred speech, numbness, weakness, tingling ROS otherwise negative   PAST MEDICAL HISTORY/PAST SURGICAL HISTORY:  Past Medical History  Diagnosis Date  . Renal disorder   . Kidney stones     MEDICATIONS:  Prior to Admission medications   Medication Sig Start Date End Date Taking? Authorizing Provider  bismuth subsalicylate (PEPTO BISMOL) 262 MG/15ML suspension Take 30 mLs by mouth once as needed for indigestion.    Historical Provider, MD  Ca Carbonate-Mag Hydroxide (ROLAIDS PO) Take 1-2 tablets by mouth as needed.    Historical Provider, MD  famotidine (PEPCID) 20 MG tablet Take 1 tablet (20 mg total) by mouth 2 (two) times daily. 12/26/13   Vanetta MuldersScott Zackowski, MD  HYDROcodone-acetaminophen (NORCO/VICODIN) 5-325 MG per tablet Take 1-2 tablets by mouth every 6 (six) hours as needed. 12/26/13   Vanetta MuldersScott Zackowski, MD  ondansetron (ZOFRAN ODT) 4 MG disintegrating tablet Take 1 tablet (4 mg total) by  mouth every 8 (eight) hours as needed. 12/26/13   Vanetta MuldersScott Zackowski, MD  promethazine (PHENERGAN) 25 MG tablet Take 1 tablet (25 mg total) by mouth every 6 (six) hours as needed for nausea. 05/04/11 05/11/11  Margarita Grizzleanielle Ray, MD  promethazine (PHENERGAN) 25 MG tablet Take 1 tablet (25 mg total) by mouth every 6 (six) hours as needed. 12/26/13   Vanetta MuldersScott Zackowski, MD    ALLERGIES:  No Known Allergies  SOCIAL HISTORY:  History  Substance Use Topics  . Smoking status: Current Every Day Smoker -- 1.00 packs/day    Types: Cigarettes  . Smokeless tobacco: Not on file  . Alcohol Use: Yes     Comment: occassionally    FAMILY HISTORY: History reviewed. No pertinent family history.  EXAM: BP 149/101 mmHg  Pulse 68  Temp(Src) 97.6 F (36.4 C) (Oral)  Resp 20  Ht 5\' 11"  (1.803 m)  Wt 180 lb (81.647 kg)  BMI 25.12 kg/m2  SpO2 100% CONSTITUTIONAL: Alert and oriented and responds appropriately to questions. Well-appearing; well-nourished; Appears uncomfortable, non toxic HEAD: Normocephalic EYES: Conjunctivae clear, PERRL ENT: normal nose; no rhinorrhea; moist mucous membranes; pharynx without lesions noted NECK: Supple, no meningismus, no LAD  CARD: RRR; S1 and S2 appreciated; no murmurs, no clicks, no rubs, no gallops RESP: Normal chest excursion without splinting or tachypnea; breath sounds clear and equal bilaterally; no wheezes, no rhonchi, no rales,  ABD/GI: Normal bowel sounds; non-distended; soft, Left CVA tenderness and mild left-sided abdominal pain without guarding or rebound GU: Circumcised, normal urethral meatus, no discharge or blood, no testicular tenderness  or masses, no scrotal masses, 2+ femoral pulses bilaterally BACK:  The back appears normal and is non-tender to palpation, there is no CVA tenderness EXT: Normal ROM in all joints; non-tender to palpation; no edema; normal capillary refill; no cyanosis    SKIN: Normal color for age and race; warm NEURO: Moves all extremities  equally PSYCH: The patient's mood and manner are appropriate. Grooming and personal hygiene are appropriate.    MEDICAL DECISION MAKING: Patient here with left flank pain radiating into his left testicle someone to his prior kidney stones. Has had a CT scan in July 2014 in this emergency department to confirmed a ureteral stone. He appears uncomfortable today but is nontoxic. Suspect kidney stone. Do not feel that this time he needs repeat imaging unless we are unable to get his pain controlled or his urine appears infected. We'll give IV fluids, Zofran, Toradol, Dilaudid. We'll obtain labs, urine.  ED PROGRESS: Patient's labs show mild leukocytosis with left shift which is likely reactive. Urine shows large hemoglobin and few bacteria but no other signs of infection. Creatinine is 1.2 which is baseline. He reports feeling much better after 3 rounds of Dilaudid and is tolerating by mouth. We'll discharge home with prescriptions for Percocet, ibuprofen, Phenergan and Flomax. We'll give [facility] follow-up information. Discussed return precautions. He verbalizes understanding and is comfortable with plan.      I personally performed the services described in this documentation, which was scribed in my presence. The recorded information has been reviewed and is accurate.   Layla Maw Ward, DO 08/14/14 1559

## 2014-08-14 NOTE — Discharge Instructions (Signed)
Kidney Stones  Kidney stones (urolithiasis) are deposits that form inside your kidneys. The intense pain is caused by the stone moving through the urinary tract. When the stone moves, the ureter goes into spasm around the stone. The stone is usually passed in the urine.   CAUSES   · A disorder that makes certain neck glands produce too much parathyroid hormone (primary hyperparathyroidism).  · A buildup of uric acid crystals, similar to gout in your joints.  · Narrowing (stricture) of the ureter.  · A kidney obstruction present at birth (congenital obstruction).  · Previous surgery on the kidney or ureters.  · Numerous kidney infections.  SYMPTOMS   · Feeling sick to your stomach (nauseous).  · Throwing up (vomiting).  · Blood in the urine (hematuria).  · Pain that usually spreads (radiates) to the groin.  · Frequency or urgency of urination.  DIAGNOSIS   · Taking a history and physical exam.  · Blood or urine tests.  · CT scan.  · Occasionally, an examination of the inside of the urinary bladder (cystoscopy) is performed.  TREATMENT   · Observation.  · Increasing your fluid intake.  · Extracorporeal shock wave lithotripsy--This is a noninvasive procedure that uses shock waves to break up kidney stones.  · Surgery may be needed if you have severe pain or persistent obstruction. There are various surgical procedures. Most of the procedures are performed with the use of small instruments. Only small incisions are needed to accommodate these instruments, so recovery time is minimized.  The size, location, and chemical composition are all important variables that will determine the proper choice of action for you. Talk to your health care provider to better understand your situation so that you will minimize the risk of injury to yourself and your kidney.   HOME CARE INSTRUCTIONS   · Drink enough water and fluids to keep your urine clear or pale yellow. This will help you to pass the stone or stone fragments.  · Strain  all urine through the provided strainer. Keep all particulate matter and stones for your health care provider to see. The stone causing the pain may be as small as a grain of salt. It is very important to use the strainer each and every time you pass your urine. The collection of your stone will allow your health care provider to analyze it and verify that a stone has actually passed. The stone analysis will often identify what you can do to reduce the incidence of recurrences.  · Only take over-the-counter or prescription medicines for pain, discomfort, or fever as directed by your health care provider.  · Make a follow-up appointment with your health care provider as directed.  · Get follow-up X-rays if required. The absence of pain does not always mean that the stone has passed. It may have only stopped moving. If the urine remains completely obstructed, it can cause loss of kidney function or even complete destruction of the kidney. It is your responsibility to make sure X-rays and follow-ups are completed. Ultrasounds of the kidney can show blockages and the status of the kidney. Ultrasounds are not associated with any radiation and can be performed easily in a matter of minutes.  SEEK MEDICAL CARE IF:  · You experience pain that is progressive and unresponsive to any pain medicine you have been prescribed.  SEEK IMMEDIATE MEDICAL CARE IF:   · Pain cannot be controlled with the prescribed medicine.  · You have a fever or   shaking chills.  · The severity or intensity of pain increases over 18 hours and is not relieved by pain medicine.  · You develop a new onset of abdominal pain.  · You feel faint or pass out.  · You are unable to urinate.  MAKE SURE YOU:   · Understand these instructions.  · Will watch your condition.  · Will get help right away if you are not doing well or get worse.  Document Released: 05/06/2005 Document Revised: 01/06/2013 Document Reviewed: 10/07/2012  ExitCare® Patient Information ©2015  [employer]. This information is not intended to replace advice given to you by your health care provider. Make sure you discuss any questions you have with your health care provider.    Dietary Guidelines to Help Prevent Kidney Stones  Your risk of kidney stones can be decreased by adjusting the foods you eat. The most important thing you can do is drink enough fluid. You should drink enough fluid to keep your urine clear or pale yellow. The following guidelines provide specific information for the type of kidney stone you have had.  GUIDELINES ACCORDING TO TYPE OF KIDNEY STONE  Calcium Oxalate Kidney Stones  · Reduce the amount of salt you eat. Foods that have a lot of salt cause your body to release excess calcium into your urine. The excess calcium can combine with a substance called oxalate to form kidney stones.  · Reduce the amount of animal protein you eat if the amount you eat is excessive. Animal protein causes your body to release excess calcium into your urine. Ask your dietitian how much protein from animal sources you should be eating.  · Avoid foods that are high in oxalates. If you take vitamins, they should have less than 500 mg of vitamin C. Your body turns vitamin C into oxalates. You do not need to avoid fruits and vegetables high in vitamin C.  Calcium Phosphate Kidney Stones  · Reduce the amount of salt you eat to help prevent the release of excess calcium into your urine.  · Reduce the amount of animal protein you eat if the amount you eat is excessive. Animal protein causes your body to release excess calcium into your urine. Ask your dietitian how much protein from animal sources you should be eating.  · Get enough calcium from food or take a calcium supplement (ask your dietitian for recommendations). Food sources of calcium that do not increase your risk of kidney stones include:  ¨ Broccoli.  ¨ Dairy products, such as cheese and yogurt.  ¨ Pudding.  Uric Acid Kidney Stones  · Do not  have more than 6 oz of animal protein per day.  FOOD SOURCES  Animal Protein Sources  · Meat (all types).  · Poultry.  · Eggs.  · Fish, seafood.  Foods High in Salt  · Salt seasonings.  · Soy sauce.  · Teriyaki sauce.  · Cured and processed meats.  · Salted crackers and snack foods.  · Fast food.  · Canned soups and most canned foods.  Foods High in Oxalates  · Grains:  ¨ Amaranth.  ¨ Barley.  ¨ Grits.  ¨ Wheat germ.  ¨ Bran.  ¨ Buckwheat flour.  ¨ All bran cereals.  ¨ Pretzels.  ¨ Whole wheat bread.  · Vegetables:  ¨ Beans (wax).  ¨ Beets and beet greens.  ¨ Collard greens.  ¨ Eggplant.  ¨ Escarole.  ¨ Leeks.  ¨ Okra.  ¨ Parsley.  ¨ Rutabagas.  ¨   Spinach.  ¨ Swiss chard.  ¨ Tomato paste.  ¨ Fried potatoes.  ¨ Sweet potatoes.  · Fruits:  ¨ Red currants.  ¨ Figs.  ¨ Kiwi.  ¨ Rhubarb.  · Meat and Other Protein Sources:  ¨ Beans (dried).  ¨ Soy burgers and other soybean products.  ¨ Miso.  ¨ Nuts (peanuts, almonds, pecans, cashews, hazelnuts).  ¨ Nut butters.  ¨ Sesame seeds and tahini (paste made of sesame seeds).  ¨ Poppy seeds.  · Beverages:  ¨ Chocolate drink mixes.  ¨ Soy milk.  ¨ Instant iced tea.  ¨ Juices made from high-oxalate fruits or vegetables.  · Other:  ¨ Carob.  ¨ Chocolate.  ¨ Fruitcake.  ¨ Marmalades.  Document Released: 08/31/2010 Document Revised: 05/11/2013 Document Reviewed: 04/02/2013  ExitCare® Patient Information ©2015 [employer]. This information is not intended to replace advice given to you by your health care provider. Make sure you discuss any questions you have with your health care provider.

## 2014-08-14 NOTE — ED Notes (Signed)
Patient complaining of left flank pain radiating into testicle that started today. Patient vomiting during triage.

## 2014-08-14 NOTE — ED Notes (Signed)
Pt verbalized understanding of no driving and to use caution within 4 hours of taking pain meds due to meds cause drowsiness 

## 2014-08-14 NOTE — ED Notes (Signed)
MD at bedside. 

## 2014-08-19 ENCOUNTER — Emergency Department (HOSPITAL_COMMUNITY)
Admission: EM | Admit: 2014-08-19 | Discharge: 2014-08-19 | Disposition: A | Discharge status: Home / Self Care | Payer: Self-pay | Attending: Emergency Medicine | Admitting: Emergency Medicine

## 2014-08-19 ENCOUNTER — Emergency Department (HOSPITAL_COMMUNITY): Payer: Self-pay

## 2014-08-19 ENCOUNTER — Encounter (HOSPITAL_COMMUNITY): Payer: Self-pay

## 2014-08-19 DIAGNOSIS — R109 Unspecified abdominal pain: Secondary | ICD-10-CM

## 2014-08-19 DIAGNOSIS — Z720 Tobacco use: Secondary | ICD-10-CM | POA: Insufficient documentation

## 2014-08-19 DIAGNOSIS — Z87448 Personal history of other diseases of urinary system: Secondary | ICD-10-CM | POA: Insufficient documentation

## 2014-08-19 DIAGNOSIS — Z79899 Other long term (current) drug therapy: Secondary | ICD-10-CM | POA: Insufficient documentation

## 2014-08-19 DIAGNOSIS — N200 Calculus of kidney: Secondary | ICD-10-CM | POA: Insufficient documentation

## 2014-08-19 LAB — URINALYSIS, ROUTINE W REFLEX MICROSCOPIC
Bilirubin Urine: NEGATIVE
GLUCOSE, UA: NEGATIVE mg/dL
KETONES UR: NEGATIVE mg/dL
Leukocytes, UA: NEGATIVE
Nitrite: NEGATIVE
Protein, ur: NEGATIVE mg/dL
SPECIFIC GRAVITY, URINE: 1.02 (ref 1.005–1.030)
Urobilinogen, UA: 0.2 mg/dL (ref 0.0–1.0)
pH: 6.5 (ref 5.0–8.0)

## 2014-08-19 LAB — URINE MICROSCOPIC-ADD ON

## 2014-08-19 MED ORDER — TAMSULOSIN HCL 0.4 MG PO CAPS: 0.4000 mg | ORAL_CAPSULE | Freq: Every day | ORAL | Status: AC

## 2014-08-19 MED ORDER — KETOROLAC TROMETHAMINE 10 MG PO TABS: 10.0000 mg | ORAL_TABLET | Freq: Four times a day (QID) | ORAL | Status: AC | PRN

## 2014-08-19 MED ORDER — ONDANSETRON HCL 4 MG/2ML IJ SOLN
INTRAMUSCULAR | Status: AC
  Filled 2014-08-19: qty 2

## 2014-08-19 MED ORDER — SODIUM CHLORIDE 0.9 % IV SOLN
INTRAVENOUS | Status: DC
  Administered 2014-08-19: 07:00:00 via INTRAVENOUS

## 2014-08-19 MED ORDER — FENTANYL CITRATE 0.05 MG/ML IJ SOLN
100.0000 ug | Freq: Once | INTRAMUSCULAR | Status: AC
  Administered 2014-08-19: 100 ug via INTRAVENOUS
  Filled 2014-08-19: qty 2

## 2014-08-19 MED ORDER — ONDANSETRON HCL 4 MG/2ML IJ SOLN: 4.0000 mg | Freq: Once | INTRAMUSCULAR | Status: DC

## 2014-08-19 MED ORDER — HYDROMORPHONE HCL 1 MG/ML IJ SOLN
1.0000 mg | Freq: Once | INTRAMUSCULAR | Status: AC
  Administered 2014-08-19: 1 mg via INTRAVENOUS
  Filled 2014-08-19: qty 1

## 2014-08-19 MED ORDER — FENTANYL CITRATE 0.05 MG/ML IJ SOLN
50.0000 ug | Freq: Once | INTRAMUSCULAR | Status: AC
  Administered 2014-08-19: 50 ug via INTRAVENOUS
  Filled 2014-08-19: qty 2

## 2014-08-19 MED ORDER — OXYCODONE-ACETAMINOPHEN 5-325 MG PO TABS: 2.0000 | ORAL_TABLET | ORAL | Status: DC | PRN

## 2014-08-19 MED ORDER — ONDANSETRON HCL 4 MG/2ML IJ SOLN
4.0000 mg | Freq: Once | INTRAMUSCULAR | Status: AC
  Administered 2014-08-19: 4 mg via INTRAVENOUS

## 2014-08-19 NOTE — ED Provider Notes (Signed)
Patient reexamined at 10:15:   No acute abdomen. Results of CT scan discussed with patient. He will follow-up with urology. Discharge medications Percocet, Toradol 10 mg and Flomax 0.4 mg  Donnetta HutchingBrian Glennie Bose, MD 08/19/14 1040

## 2014-08-19 NOTE — ED Notes (Signed)
Pt reports history of kidney stones, seen here last Sunday for same, states pain returned yesterday, severe this am.

## 2014-08-19 NOTE — Discharge Instructions (Signed)
You have a 3 mm kidney stone on your left side. Medication for pain, medication for inflammation, medication to increase your urinary flow. Follow-up with urologist. Phone number given. [facility] [place] is our urology center

## 2014-08-19 NOTE — ED Provider Notes (Signed)
CSN: [id number]     Arrival date & time 08/19/14  [id number] History   First MD Initiated Contact with Patient 08/19/14 [phone]     Chief Complaint  Patient presents with  . Flank Pain     (Consider location/radiation/quality/duration/timing/severity/associated sxs/prior Treatment) HPI  Patient reports history of kidney stones since he was 34 years old. He was last seen in the ED here on March 27 with left-sided flank pain radiating to his left testicle. He had a urinalysis consistent with renal stone and was treated with hydromorphone 3 with pain relief and was discharged home. He reports he gave his prescriptions today sister to fill [facility] [place] however she lost his prescriptions. However he states he was pain-free until yesterday when he started getting some mild discomfort however about 4 AM today his pain got intense with left flank pain radiating into his left testicle. He has had vomiting. He denies fever. He does report heavy milk intake and drinks about a half a gallon a day, he also drinks about a 2 Liters of soda a day.  PCP none  Past Medical History  Diagnosis Date  . Renal disorder   . Kidney stones    Past Surgical History  Procedure Laterality Date  . Kidney stone surgery     No family history on file. History  Substance Use Topics  . Smoking status: Current Every Day Smoker -- 1.00 packs/day    Types: Cigarettes  . Smokeless tobacco: Not on file  . Alcohol Use: Yes     Comment: occassionally  employed  Review of Systems  All other systems reviewed and are negative.     Allergies  Morphine and related  Home Medications   Prior to Admission medications   Medication Sig Start Date End Date Taking? Authorizing Provider  ibuprofen (ADVIL,MOTRIN) 800 MG tablet Take 1 tablet (800 mg total) by mouth every 8 (eight) hours as needed for mild pain. 08/14/14  Yes Kristen N Ward, DO  oxyCODONE-acetaminophen (PERCOCET/ROXICET) 5-325 MG per tablet Take 1-2  tablets by mouth every 6 (six) hours as needed. 08/14/14   Kristen N Ward, DO  promethazine (PHENERGAN) 25 MG tablet Take 1 tablet (25 mg total) by mouth every 6 (six) hours as needed for nausea or vomiting. 08/14/14   Layla MawKristen N Ward, DO  tamsulosin (FLOMAX) 0.4 MG CAPS capsule Take 1 capsule (0.4 mg total) by mouth daily. 08/14/14   Kristen N Ward, DO   BP 125/91 mmHg  Pulse 85  Temp(Src) 97.8 F (36.6 C) (Oral)  Resp 22  Ht 5\' 11"  (1.803 m)  Wt 180 lb (81.647 kg)  BMI 25.12 kg/m2  SpO2 99%  Vital signs normal   Physical Exam  Constitutional: He is oriented to person, place, and time. He appears well-developed and well-nourished.  Non-toxic appearance. He does not appear ill. He appears distressed.  HENT:  Head: Normocephalic and atraumatic.  Right Ear: External ear normal.  Left Ear: External ear normal.  Nose: Nose normal. No mucosal edema or rhinorrhea.  Mouth/Throat: Oropharynx is clear and moist and mucous membranes are normal. No dental abscesses or uvula swelling.  Eyes: Conjunctivae and EOM are normal. Pupils are equal, round, and reactive to light.  Neck: Normal range of motion and full passive range of motion without pain. Neck supple.  Cardiovascular: Normal rate, regular rhythm and normal heart sounds.  Exam reveals no gallop and no friction rub.   No murmur heard. Pulmonary/Chest: Effort normal and breath sounds  normal. No respiratory distress. He has no wheezes. He has no rhonchi. He has no rales. He exhibits no tenderness and no crepitus.  Abdominal: Soft. Normal appearance and bowel sounds are normal. He exhibits no distension. There is no tenderness. There is no rebound and no guarding.  Musculoskeletal: Normal range of motion. He exhibits no edema or tenderness.  Moves all extremities well.   Neurological: He is alert and oriented to person, place, and time. He has normal strength. No cranial nerve deficit.  Skin: Skin is warm, dry and intact. No rash noted. No  erythema. No pallor.  Psychiatric: He has a normal mood and affect. His speech is normal and behavior is normal. His mood appears not anxious.  Nursing note and vitals reviewed.   ED Course  Procedures (including critical care time)  Medications  0.9 %  sodium chloride infusion ( Intravenous New Bag/Given 08/19/14 0648)  fentaNYL (SUBLIMAZE) injection 100 mcg (100 mcg Intravenous Given 08/19/14 0647)  ondansetron (ZOFRAN) injection 4 mg (4 mg Intravenous Given 08/19/14 0655)    Pt has had 2 prior CT scans. In July 2014 he was noted to have bilateral renal calculi, in August 2014 he was noted to have a large right ureteral stone that he states required surgery to remove.  Renal CT scan ordered today. Pt was given IV pain and nausea medications.   07:30 pt turned over to Dr Adriana Simas at change of shift to get results of his AP CT scan and to do his disposition.    Labs Review  Pending      Results for orders placed or performed during the hospital encounter of 08/14/14  CBC with Differential  Result Value Ref Range   WBC 13.1 (H) 4.0 - 10.5 K/uL   RBC 5.22 4.22 - 5.81 MIL/uL   Hemoglobin 15.0 13.0 - 17.0 g/dL   HCT 16.1 09.6 - 04.5 %   MCV 84.3 78.0 - 100.0 fL   MCH 28.7 26.0 - 34.0 pg   MCHC 34.1 30.0 - 36.0 g/dL   RDW 40.9 81.1 - 91.4 %   Platelets 247 150 - 400 K/uL   Neutrophils Relative % 80 (H) 43 - 77 %   Neutro Abs 10.6 (H) 1.7 - 7.7 K/uL   Lymphocytes Relative 15 12 - 46 %   Lymphs Abs 2.0 0.7 - 4.0 K/uL   Monocytes Relative 4 3 - 12 %   Monocytes Absolute 0.5 0.1 - 1.0 K/uL   Eosinophils Relative 1 0 - 5 %   Eosinophils Absolute 0.1 0.0 - 0.7 K/uL   Basophils Relative 0 0 - 1 %   Basophils Absolute 0.0 0.0 - 0.1 K/uL  Comprehensive metabolic panel  Result Value Ref Range   Sodium 141 135 - 145 mmol/L   Potassium 3.4 (L) 3.5 - 5.1 mmol/L   Chloride 107 96 - 112 mmol/L   CO2 26 19 - 32 mmol/L   Glucose, Bld 121 (H) 70 - 99 mg/dL   BUN 16 6 - 23 mg/dL    Creatinine, Ser 7.82 0.50 - 1.35 mg/dL   Calcium 9.2 8.4 - 95.6 mg/dL   Total Protein 7.1 6.0 - 8.3 g/dL   Albumin 4.0 3.5 - 5.2 g/dL   AST 37 0 - 37 U/L   ALT 52 0 - 53 U/L   Alkaline Phosphatase 79 39 - 117 U/L   Total Bilirubin 0.5 0.3 - 1.2 mg/dL   GFR calc non Af Amer 73 (L) >90 mL/min  GFR calc Af Amer 85 (L) >90 mL/min   Anion gap 8 5 - 15  Urinalysis, Routine w reflex microscopic  Result Value Ref Range   Color, Urine BROWN (A) YELLOW   APPearance TURBID (A) CLEAR   Specific Gravity, Urine 1.025 1.005 - 1.030   pH 6.5 5.0 - 8.0   Glucose, UA NEGATIVE NEGATIVE mg/dL   Hgb urine dipstick LARGE (A) NEGATIVE   Bilirubin Urine SMALL (A) NEGATIVE   Ketones, ur NEGATIVE NEGATIVE mg/dL   Protein, ur 30 (A) NEGATIVE mg/dL   Urobilinogen, UA 0.2 0.0 - 1.0 mg/dL   Nitrite NEGATIVE NEGATIVE   Leukocytes, UA NEGATIVE NEGATIVE  Urine microscopic-add on  Result Value Ref Range   Squamous Epithelial / LPF RARE RARE   WBC, UA 3-6 <3 WBC/hpf   RBC / HPF TOO NUMEROUS TO COUNT <3 RBC/hpf   Bacteria, UA FEW (A) RARE   Urine-Other MUCOUS PRESENT        Imaging Review No results found.   EKG Interpretation None      MDM   Final diagnoses:  Acute left flank pain    Disposition  Pending   Devoria Albe, MD, Concha Pyo, MD 08/19/14 [phone]

## 2014-08-22 ENCOUNTER — Emergency Department (HOSPITAL_COMMUNITY): Payer: Self-pay

## 2014-08-22 ENCOUNTER — Encounter (HOSPITAL_COMMUNITY): Payer: Self-pay | Admitting: *Deleted

## 2014-08-22 ENCOUNTER — Emergency Department (HOSPITAL_COMMUNITY)
Admission: EM | Admit: 2014-08-22 | Discharge: 2014-08-23 | Disposition: A | Discharge status: Home / Self Care | Payer: Self-pay | Attending: Emergency Medicine | Admitting: Emergency Medicine

## 2014-08-22 DIAGNOSIS — Z9889 Other specified postprocedural states: Secondary | ICD-10-CM | POA: Insufficient documentation

## 2014-08-22 DIAGNOSIS — N23 Unspecified renal colic: Secondary | ICD-10-CM | POA: Insufficient documentation

## 2014-08-22 DIAGNOSIS — R109 Unspecified abdominal pain: Secondary | ICD-10-CM

## 2014-08-22 DIAGNOSIS — Z720 Tobacco use: Secondary | ICD-10-CM | POA: Insufficient documentation

## 2014-08-22 DIAGNOSIS — Z79899 Other long term (current) drug therapy: Secondary | ICD-10-CM | POA: Insufficient documentation

## 2014-08-22 MED ORDER — FENTANYL CITRATE 0.05 MG/ML IJ SOLN
100.0000 ug | Freq: Once | INTRAMUSCULAR | Status: AC
  Administered 2014-08-22: 100 ug via INTRAVENOUS
  Filled 2014-08-22: qty 2

## 2014-08-22 MED ORDER — ONDANSETRON HCL 4 MG/2ML IJ SOLN
4.0000 mg | Freq: Once | INTRAMUSCULAR | Status: AC
  Administered 2014-08-22: 4 mg via INTRAVENOUS
  Filled 2014-08-22: qty 2

## 2014-08-22 MED ORDER — ONDANSETRON HCL 4 MG/2ML IJ SOLN: 4.0000 mg | Freq: Once | INTRAMUSCULAR | Status: DC

## 2014-08-22 NOTE — ED Provider Notes (Signed)
CSN: [id number]     Arrival date & time 08/22/14  2022 History   First MD Initiated Contact with Patient 08/22/14 2334     Chief Complaint  Patient presents with  . Flank Pain     (Consider location/radiation/quality/duration/timing/severity/associated sxs/prior Treatment) Patient is a 34 y.o. male presenting with flank pain. The history is provided by the patient.  Flank Pain This is a recurrent problem. The current episode started in the past 7 days. The problem occurs intermittently. The problem has been gradually worsening. Associated symptoms include nausea. Nothing aggravates the symptoms. He has tried oral narcotics for the symptoms. The treatment provided no relief.    Past Medical History  Diagnosis Date  . Renal disorder   . Kidney stones    Past Surgical History  Procedure Laterality Date  . Kidney stone surgery     History reviewed. No pertinent family history. History  Substance Use Topics  . Smoking status: Current Every Day Smoker -- 1.00 packs/day    Types: Cigarettes  . Smokeless tobacco: Not on file  . Alcohol Use: Yes     Comment: occassionally    Review of Systems  Gastrointestinal: Positive for nausea.  Genitourinary: Positive for flank pain.  All other systems reviewed and are negative.     Allergies  Morphine and related  Home Medications   Prior to Admission medications   Medication Sig Start Date End Date Taking? Authorizing Provider  ketorolac (TORADOL) 10 MG tablet Take 1 tablet (10 mg total) by mouth every 6 (six) hours as needed. 08/19/14  Yes Donnetta HutchingBrian Cook, MD  oxyCODONE-acetaminophen (PERCOCET) 5-325 MG per tablet Take 2 tablets by mouth every 4 (four) hours as needed. 08/19/14  Yes Donnetta HutchingBrian Cook, MD  tamsulosin (FLOMAX) 0.4 MG CAPS capsule Take 1 capsule (0.4 mg total) by mouth daily. 08/19/14  Yes Donnetta HutchingBrian Cook, MD  ibuprofen (ADVIL,MOTRIN) 800 MG tablet Take 1 tablet (800 mg total) by mouth every 8 (eight) hours as needed for mild pain. Patient  not taking: Reported on 08/22/2014 08/14/14   Kristen N Ward, DO   BP 113/76 mmHg  Pulse 70  Temp(Src) 98.8 F (37.1 C) (Oral)  Resp 18  Ht 5\' 11"  (1.803 m)  Wt 180 lb (81.647 kg)  BMI 25.12 kg/m2  SpO2 100% Physical Exam  Constitutional: He is oriented to person, place, and time. He appears well-developed and well-nourished.  Non-toxic appearance.  HENT:  Head: Normocephalic.  Right Ear: Tympanic membrane and external ear normal.  Left Ear: Tympanic membrane and external ear normal.  Eyes: EOM and lids are normal. Pupils are equal, round, and reactive to light.  Neck: Normal range of motion. Neck supple. Carotid bruit is not present.  Cardiovascular: Normal rate, regular rhythm, normal heart sounds, intact distal pulses and normal pulses.   Pulmonary/Chest: Breath sounds normal. No respiratory distress.  Abdominal: Soft. Bowel sounds are normal. There is no tenderness. There is CVA tenderness. There is no guarding.  Left  And right CVAT.  Musculoskeletal: Normal range of motion.  Lymphadenopathy:       Head (right side): No submandibular adenopathy present.       Head (left side): No submandibular adenopathy present.    He has no cervical adenopathy.  Neurological: He is alert and oriented to person, place, and time. He has normal strength. No cranial nerve deficit or sensory deficit.  Skin: Skin is warm and dry.  Psychiatric: He has a normal mood and affect. His speech is normal.  Nursing  note and vitals reviewed.   ED Course  Procedures (including critical care time) Labs Review Labs Reviewed  URINALYSIS, ROUTINE W REFLEX MICROSCOPIC    Imaging Review No results found.   EKG Interpretation None      MDM  UA reveals small amount of hgb. 0-2 WBC and 7-10 RBC. abd xray reveals calcifications over the left kidney and left pelvis consistent with stones seen on prior CT.   Pain improved but not completely resolved. A second dose of fentanyl along with Toradol given to  the patient.  The plan at this time for the patient to continue his Flomax and Percocet. The patient has been advised to call the [facility] on tomorrow to set up an appointment time for evaluation concerning his recurrent renal colic.    Final diagnoses:  None    *I have reviewed nursing notes, vital signs, and all appropriate lab and imaging results for this patient.[address], PA-C 08/23/14 0124  Geoffery Lyons, MD 08/23/14 [phone]

## 2014-08-22 NOTE — ED Notes (Signed)
Pt reporting continued pain from kidney stone.  Pt was seen and treated on 3/27 and 4/1 for same. Reports pain initially on left side, but now is also on right. Reporting some nausea and vomiting as well.

## 2014-08-23 LAB — URINALYSIS, ROUTINE W REFLEX MICROSCOPIC
Bilirubin Urine: NEGATIVE
GLUCOSE, UA: NEGATIVE mg/dL
Ketones, ur: 15 mg/dL — AB
LEUKOCYTES UA: NEGATIVE
Nitrite: NEGATIVE
Protein, ur: NEGATIVE mg/dL
Specific Gravity, Urine: >1.03 — ABNORMAL HIGH (ref 1.005–1.030)
Urobilinogen, UA: 0.2 mg/dL (ref 0.0–1.0)
pH: 5.5 (ref 5.0–8.0)

## 2014-08-23 LAB — URINE MICROSCOPIC-ADD ON

## 2014-08-23 MED ORDER — FENTANYL CITRATE 0.05 MG/ML IJ SOLN
50.0000 ug | Freq: Once | INTRAMUSCULAR | Status: AC
  Administered 2014-08-23: 50 ug via INTRAVENOUS
  Filled 2014-08-23: qty 2

## 2014-08-23 MED ORDER — KETOROLAC TROMETHAMINE 30 MG/ML IJ SOLN: 30.0000 mg | Freq: Once | INTRAMUSCULAR | Status: DC

## 2014-08-23 MED ORDER — OXYCODONE-ACETAMINOPHEN 5-325 MG PO TABS: 1.0000 | ORAL_TABLET | Freq: Four times a day (QID) | ORAL | Status: AC | PRN

## 2014-08-23 NOTE — ED Notes (Signed)
Discharge instructions and prescription given and reviewed with patient.  Patient verbalized understanding of sedating effects of Percocet and to follow up with urologist (to call office tomorrow to make appointment).  Patient ambulatory; discharged home in good condition.

## 2014-08-23 NOTE — Discharge Instructions (Signed)
Please call [facility] tomorrow for office appointment as soon as possible. Kidney Stones Kidney stones (urolithiasis) are solid masses that form inside your kidneys. The intense pain is caused by the stone moving through the kidney, ureter, bladder, and urethra (urinary tract). When the stone moves, the ureter starts to spasm around the stone. The stone is usually passed in your pee (urine).  HOME CARE  Drink enough fluids to keep your pee clear or pale yellow. This helps to get the stone out.  Strain all pee through the provided strainer. Do not pee without peeing through the strainer, not even once. If you pee the stone out, catch it in the strainer. The stone may be as small as a grain of salt. Take this to your doctor. This will help your doctor figure out what you can do to try to prevent more kidney stones.  Only take medicine as told by your doctor.  Follow up with your doctor as told.  Get follow-up X-rays as told by your doctor. GET HELP IF: You have pain that gets worse even if you have been taking pain medicine. GET HELP RIGHT AWAY IF:   Your pain does not get better with medicine.  You have a fever or shaking chills.  Your pain increases and gets worse over 18 hours.  You have new belly (abdominal) pain.  You feel faint or pass out.  You are unable to pee. MAKE SURE YOU:   Understand these instructions.  Will watch your condition.  Will get help right away if you are not doing well or get worse. Document Released: 10/23/2007 Document Revised: 01/06/2013 Document Reviewed: 10/07/2012 [facility] Patient Information 2015 [employer] [place]. This information is not intended to replace advice given to you by your health care provider. Make sure you discuss any questions you have with your health care provider.
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
